# Patient Record
Sex: Female | Born: 1935 | Race: White | Hispanic: No | Marital: Married
Health system: Southern US, Community
[De-identification: ages and names within clinical notes are randomized; demographics above are authoritative.]

---

## 2004-07-24 ENCOUNTER — Ambulatory Visit: Payer: Self-pay | Admitting: Internal Medicine

## 2005-08-11 ENCOUNTER — Ambulatory Visit: Payer: Self-pay | Admitting: Internal Medicine

## 2006-08-12 ENCOUNTER — Ambulatory Visit: Payer: Self-pay | Admitting: Internal Medicine

## 2007-08-25 ENCOUNTER — Ambulatory Visit: Payer: Self-pay | Admitting: Internal Medicine

## 2008-09-06 ENCOUNTER — Ambulatory Visit: Payer: Self-pay | Admitting: Internal Medicine

## 2009-09-18 ENCOUNTER — Ambulatory Visit: Payer: Self-pay | Admitting: Internal Medicine

## 2010-10-02 ENCOUNTER — Ambulatory Visit: Payer: Self-pay | Admitting: Internal Medicine

## 2012-10-12 ENCOUNTER — Inpatient Hospital Stay: Payer: Self-pay | Admitting: Family Medicine

## 2012-10-12 LAB — CBC WITH DIFFERENTIAL/PLATELET
Basophil #: 0.1 10*3/uL (ref 0.0–0.1)
Eosinophil #: 0.1 10*3/uL (ref 0.0–0.7)
HCT: 32.8 % — ABNORMAL LOW (ref 35.0–47.0)
HGB: 10.6 g/dL — ABNORMAL LOW (ref 12.0–16.0)
Lymphocyte #: 0.8 10*3/uL — ABNORMAL LOW (ref 1.0–3.6)
Lymphocyte %: 12.3 %
MCH: 30.6 pg (ref 26.0–34.0)
MCHC: 32.3 g/dL (ref 32.0–36.0)
MCV: 95 fL (ref 80–100)
Monocyte #: 0.5 x10 3/mm (ref 0.2–0.9)
Monocyte %: 7.5 %
Neutrophil #: 5.3 10*3/uL (ref 1.4–6.5)
Neutrophil %: 77.5 %
Platelet: 228 10*3/uL (ref 150–440)
RBC: 3.46 10*6/uL — ABNORMAL LOW (ref 3.80–5.20)
WBC: 6.9 10*3/uL (ref 3.6–11.0)

## 2012-10-12 LAB — URINALYSIS, COMPLETE
Bilirubin,UR: NEGATIVE
Ketone: NEGATIVE
Nitrite: NEGATIVE
Ph: 5 (ref 4.5–8.0)
Protein: 30
Squamous Epithelial: NONE SEEN
WBC UR: 59 /HPF (ref 0–5)

## 2012-10-12 LAB — COMPREHENSIVE METABOLIC PANEL
Albumin: 3.4 g/dL (ref 3.4–5.0)
Alkaline Phosphatase: 137 U/L — ABNORMAL HIGH (ref 50–136)
Bilirubin,Total: 0.4 mg/dL (ref 0.2–1.0)
Calcium, Total: 8.2 mg/dL — ABNORMAL LOW (ref 8.5–10.1)
Chloride: 109 mmol/L — ABNORMAL HIGH (ref 98–107)
Creatinine: 0.84 mg/dL (ref 0.60–1.30)
EGFR (Non-African Amer.): >60
Osmolality: 288 (ref 275–301)
SGPT (ALT): 40 U/L (ref 12–78)
Sodium: 141 mmol/L (ref 136–145)

## 2012-10-12 LAB — APTT: Activated PTT: 36.4 secs — ABNORMAL HIGH (ref 23.6–35.9)

## 2012-10-12 LAB — CK TOTAL AND CKMB (NOT AT ARMC): CK-MB: 3.1 ng/mL (ref 0.5–3.6)

## 2012-10-12 LAB — PRO B NATRIURETIC PEPTIDE: B-Type Natriuretic Peptide: 2007 pg/mL — ABNORMAL HIGH (ref 0–450)

## 2012-10-13 LAB — CBC WITH DIFFERENTIAL/PLATELET
Basophil %: 0.7 %
HCT: 32.6 % — ABNORMAL LOW (ref 35.0–47.0)
HGB: 10.6 g/dL — ABNORMAL LOW (ref 12.0–16.0)
MCV: 93 fL (ref 80–100)
Neutrophil %: 77.1 %
RDW: 15.7 % — ABNORMAL HIGH (ref 11.5–14.5)

## 2012-10-13 LAB — BASIC METABOLIC PANEL
Anion Gap: 11 (ref 7–16)
Calcium, Total: 8.7 mg/dL (ref 8.5–10.1)
Chloride: 109 mmol/L — ABNORMAL HIGH (ref 98–107)
Co2: 24 mmol/L (ref 21–32)
Creatinine: 0.92 mg/dL (ref 0.60–1.30)
EGFR (African American): >60
Glucose: 163 mg/dL — ABNORMAL HIGH (ref 65–99)
Osmolality: 292 (ref 275–301)
Sodium: 144 mmol/L (ref 136–145)

## 2012-10-14 LAB — BASIC METABOLIC PANEL
Anion Gap: 10 (ref 7–16)
Calcium, Total: 8.5 mg/dL (ref 8.5–10.1)
Chloride: 109 mmol/L — ABNORMAL HIGH (ref 98–107)
EGFR (African American): >60
EGFR (Non-African Amer.): 58 — ABNORMAL LOW
Glucose: 84 mg/dL (ref 65–99)
Osmolality: 289 (ref 275–301)
Sodium: 144 mmol/L (ref 136–145)

## 2012-10-14 LAB — PROTIME-INR: Prothrombin Time: 19.1 secs — ABNORMAL HIGH (ref 11.5–14.7)

## 2012-10-15 LAB — URINE CULTURE

## 2014-09-20 ENCOUNTER — Inpatient Hospital Stay: Payer: Self-pay | Admitting: Family Medicine

## 2014-09-20 LAB — CBC
HCT: 43 % (ref 35.0–47.0)
HGB: 13.7 g/dL (ref 12.0–16.0)
MCH: 29.9 pg (ref 26.0–34.0)
MCHC: 31.8 g/dL — ABNORMAL LOW (ref 32.0–36.0)
MCV: 94 fL (ref 80–100)
PLATELETS: 204 10*3/uL (ref 150–440)
RBC: 4.57 10*6/uL (ref 3.80–5.20)
RDW: 17.4 % — ABNORMAL HIGH (ref 11.5–14.5)
WBC: 11.4 10*3/uL — AB (ref 3.6–11.0)

## 2014-09-20 LAB — COMPREHENSIVE METABOLIC PANEL
ALK PHOS: 124 U/L — AB
ANION GAP: 10 (ref 7–16)
AST: 19 U/L (ref 15–37)
Albumin: 3.7 g/dL (ref 3.4–5.0)
BUN: 20 mg/dL — AB (ref 7–18)
Bilirubin,Total: 0.9 mg/dL (ref 0.2–1.0)
CHLORIDE: 102 mmol/L (ref 98–107)
CO2: 25 mmol/L (ref 21–32)
CREATININE: 1.13 mg/dL (ref 0.60–1.30)
Calcium, Total: 9 mg/dL (ref 8.5–10.1)
EGFR (African American): 59 — ABNORMAL LOW
EGFR (Non-African Amer.): 49 — ABNORMAL LOW
Glucose: 279 mg/dL — ABNORMAL HIGH (ref 65–99)
Osmolality: 286 (ref 275–301)
Potassium: 4.2 mmol/L (ref 3.5–5.1)
SGPT (ALT): 30 U/L
Sodium: 137 mmol/L (ref 136–145)
TOTAL PROTEIN: 7.6 g/dL (ref 6.4–8.2)

## 2014-09-20 LAB — TROPONIN I: Troponin-I: <0.02 ng/mL

## 2014-09-20 LAB — LIPASE, BLOOD: LIPASE: 78 U/L (ref 73–393)

## 2014-09-20 LAB — PROTIME-INR
INR: 2.1
Prothrombin Time: 23.3 secs — ABNORMAL HIGH (ref 11.5–14.7)

## 2014-09-21 LAB — URINALYSIS, COMPLETE
Bilirubin,UR: NEGATIVE
Glucose,UR: >=500 mg/dL (ref 0–75)
NITRITE: NEGATIVE
Ph: 5 (ref 4.5–8.0)
Specific Gravity: 1.04 (ref 1.003–1.030)
WBC UR: 38 /HPF (ref 0–5)

## 2014-09-21 LAB — CBC WITH DIFFERENTIAL/PLATELET
BASOS ABS: 0 10*3/uL (ref 0.0–0.1)
BASOS PCT: 0.3 %
EOS ABS: 0 10*3/uL (ref 0.0–0.7)
Eosinophil %: 0.2 %
HCT: 36.8 % (ref 35.0–47.0)
HGB: 11.8 g/dL — AB (ref 12.0–16.0)
LYMPHS ABS: 0.6 10*3/uL — AB (ref 1.0–3.6)
Lymphocyte %: 4.9 %
MCH: 30.4 pg (ref 26.0–34.0)
MCHC: 32.1 g/dL (ref 32.0–36.0)
MCV: 95 fL (ref 80–100)
MONO ABS: 1.1 x10 3/mm — AB (ref 0.2–0.9)
Monocyte %: 9.4 %
NEUTROS PCT: 85.2 %
Neutrophil #: 9.6 10*3/uL — ABNORMAL HIGH (ref 1.4–6.5)
Platelet: 157 10*3/uL (ref 150–440)
RBC: 3.88 10*6/uL (ref 3.80–5.20)
RDW: 17.4 % — ABNORMAL HIGH (ref 11.5–14.5)
WBC: 11.2 10*3/uL — ABNORMAL HIGH (ref 3.6–11.0)

## 2014-09-21 LAB — BASIC METABOLIC PANEL
Anion Gap: 8 (ref 7–16)
BUN: 20 mg/dL — ABNORMAL HIGH (ref 7–18)
CALCIUM: 8.1 mg/dL — AB (ref 8.5–10.1)
CO2: 26 mmol/L (ref 21–32)
Chloride: 108 mmol/L — ABNORMAL HIGH (ref 98–107)
Creatinine: 1.02 mg/dL (ref 0.60–1.30)
EGFR (African American): >60
GFR CALC NON AF AMER: 55 — AB
Glucose: 208 mg/dL — ABNORMAL HIGH (ref 65–99)
Osmolality: 292 (ref 275–301)
Potassium: 3.8 mmol/L (ref 3.5–5.1)
Sodium: 142 mmol/L (ref 136–145)

## 2014-09-21 LAB — PROTIME-INR
INR: 3
Prothrombin Time: 30.1 secs — ABNORMAL HIGH (ref 11.5–14.7)

## 2014-09-21 LAB — MAGNESIUM: MAGNESIUM: 2 mg/dL

## 2014-09-22 LAB — CBC WITH DIFFERENTIAL/PLATELET
Basophil #: 0.1 10*3/uL (ref 0.0–0.1)
Basophil %: 0.8 %
EOS PCT: 0.5 %
Eosinophil #: 0.1 10*3/uL (ref 0.0–0.7)
HCT: 36.9 % (ref 35.0–47.0)
HGB: 11.9 g/dL — ABNORMAL LOW (ref 12.0–16.0)
LYMPHS ABS: 0.6 10*3/uL — AB (ref 1.0–3.6)
Lymphocyte %: 5.8 %
MCH: 30.2 pg (ref 26.0–34.0)
MCHC: 32.2 g/dL (ref 32.0–36.0)
MCV: 94 fL (ref 80–100)
MONOS PCT: 8.2 %
Monocyte #: 0.9 x10 3/mm (ref 0.2–0.9)
NEUTROS ABS: 9.3 10*3/uL — AB (ref 1.4–6.5)
Neutrophil %: 84.7 %
Platelet: 154 10*3/uL (ref 150–440)
RBC: 3.94 10*6/uL (ref 3.80–5.20)
RDW: 17.1 % — AB (ref 11.5–14.5)
WBC: 11 10*3/uL (ref 3.6–11.0)

## 2014-09-22 LAB — COMPREHENSIVE METABOLIC PANEL
ALT: 17 U/L
Albumin: 2.5 g/dL — ABNORMAL LOW (ref 3.4–5.0)
Alkaline Phosphatase: 98 U/L
Anion Gap: 9 (ref 7–16)
BILIRUBIN TOTAL: 0.9 mg/dL (ref 0.2–1.0)
BUN: 18 mg/dL (ref 7–18)
CHLORIDE: 111 mmol/L — AB (ref 98–107)
CO2: 24 mmol/L (ref 21–32)
Calcium, Total: 8.3 mg/dL — ABNORMAL LOW (ref 8.5–10.1)
Creatinine: 1.05 mg/dL (ref 0.60–1.30)
EGFR (African American): >60
EGFR (Non-African Amer.): 53 — ABNORMAL LOW
Glucose: 167 mg/dL — ABNORMAL HIGH (ref 65–99)
OSMOLALITY: 293 (ref 275–301)
Potassium: 3.8 mmol/L (ref 3.5–5.1)
SGOT(AST): 21 U/L (ref 15–37)
Sodium: 144 mmol/L (ref 136–145)
Total Protein: 6.3 g/dL — ABNORMAL LOW (ref 6.4–8.2)

## 2014-09-22 LAB — PROTIME-INR
INR: 2.2
Prothrombin Time: 23.8 secs — ABNORMAL HIGH (ref 11.5–14.7)

## 2014-09-23 LAB — PROTIME-INR
INR: 1.6
Prothrombin Time: 18.6 secs — ABNORMAL HIGH (ref 11.5–14.7)

## 2014-09-24 LAB — CBC WITH DIFFERENTIAL/PLATELET
BASOS ABS: 0.1 10*3/uL (ref 0.0–0.1)
Basophil %: 0.8 %
EOS PCT: 1.7 %
Eosinophil #: 0.1 10*3/uL (ref 0.0–0.7)
HCT: 36.3 % (ref 35.0–47.0)
HGB: 11.7 g/dL — ABNORMAL LOW (ref 12.0–16.0)
LYMPHS PCT: 8.7 %
Lymphocyte #: 0.7 10*3/uL — ABNORMAL LOW (ref 1.0–3.6)
MCH: 30.5 pg (ref 26.0–34.0)
MCHC: 32.1 g/dL (ref 32.0–36.0)
MCV: 95 fL (ref 80–100)
MONOS PCT: 9.4 %
Monocyte #: 0.7 x10 3/mm (ref 0.2–0.9)
Neutrophil #: 6.1 10*3/uL (ref 1.4–6.5)
Neutrophil %: 79.4 %
Platelet: 170 10*3/uL (ref 150–440)
RBC: 3.82 10*6/uL (ref 3.80–5.20)
RDW: 16.7 % — AB (ref 11.5–14.5)
WBC: 7.7 10*3/uL (ref 3.6–11.0)

## 2014-09-24 LAB — COMPREHENSIVE METABOLIC PANEL
ALBUMIN: 2.3 g/dL — AB (ref 3.4–5.0)
ANION GAP: 10 (ref 7–16)
AST: 33 U/L (ref 15–37)
Alkaline Phosphatase: 134 U/L — ABNORMAL HIGH
BILIRUBIN TOTAL: 1.1 mg/dL — AB (ref 0.2–1.0)
BUN: 17 mg/dL (ref 7–18)
CREATININE: 0.95 mg/dL (ref 0.60–1.30)
Calcium, Total: 8.2 mg/dL — ABNORMAL LOW (ref 8.5–10.1)
Chloride: 111 mmol/L — ABNORMAL HIGH (ref 98–107)
Co2: 24 mmol/L (ref 21–32)
EGFR (African American): >60
EGFR (Non-African Amer.): 59 — ABNORMAL LOW
Glucose: 169 mg/dL — ABNORMAL HIGH (ref 65–99)
Osmolality: 294 (ref 275–301)
POTASSIUM: 3.2 mmol/L — AB (ref 3.5–5.1)
SGPT (ALT): 24 U/L
Sodium: 145 mmol/L (ref 136–145)
TOTAL PROTEIN: 6.4 g/dL (ref 6.4–8.2)

## 2014-09-24 LAB — PROTIME-INR
INR: 1.5
PROTHROMBIN TIME: 17.9 s — AB (ref 11.5–14.7)

## 2014-09-25 LAB — PROTIME-INR
INR: 1.5
Prothrombin Time: 17.5 secs — ABNORMAL HIGH (ref 11.5–14.7)

## 2014-09-25 LAB — COMPREHENSIVE METABOLIC PANEL
ALBUMIN: 2.3 g/dL — AB (ref 3.4–5.0)
ALK PHOS: 131 U/L — AB
ANION GAP: 10 (ref 7–16)
AST: 31 U/L (ref 15–37)
BILIRUBIN TOTAL: 0.9 mg/dL (ref 0.2–1.0)
BUN: 13 mg/dL (ref 7–18)
Calcium, Total: 8.1 mg/dL — ABNORMAL LOW (ref 8.5–10.1)
Chloride: 111 mmol/L — ABNORMAL HIGH (ref 98–107)
Co2: 24 mmol/L (ref 21–32)
Creatinine: 0.84 mg/dL (ref 0.60–1.30)
EGFR (Non-African Amer.): >60
GLUCOSE: 148 mg/dL — AB (ref 65–99)
Osmolality: 292 (ref 275–301)
Potassium: 3.2 mmol/L — ABNORMAL LOW (ref 3.5–5.1)
SGPT (ALT): 24 U/L
SODIUM: 145 mmol/L (ref 136–145)
Total Protein: 6.2 g/dL — ABNORMAL LOW (ref 6.4–8.2)

## 2014-09-25 LAB — CBC WITH DIFFERENTIAL/PLATELET
BASOS ABS: 0 10*3/uL (ref 0.0–0.1)
BASOS PCT: 0.3 %
EOS ABS: 0.2 10*3/uL (ref 0.0–0.7)
EOS PCT: 2.6 %
HCT: 35.1 % (ref 35.0–47.0)
HGB: 11.2 g/dL — ABNORMAL LOW (ref 12.0–16.0)
Lymphocyte #: 0.5 10*3/uL — ABNORMAL LOW (ref 1.0–3.6)
Lymphocyte %: 6.5 %
MCH: 30.1 pg (ref 26.0–34.0)
MCHC: 31.9 g/dL — ABNORMAL LOW (ref 32.0–36.0)
MCV: 94 fL (ref 80–100)
MONO ABS: 0.7 x10 3/mm (ref 0.2–0.9)
Monocyte %: 9.6 %
NEUTROS ABS: 5.7 10*3/uL (ref 1.4–6.5)
Neutrophil %: 81 %
PLATELETS: 157 10*3/uL (ref 150–440)
RBC: 3.73 10*6/uL — ABNORMAL LOW (ref 3.80–5.20)
RDW: 16.7 % — AB (ref 11.5–14.5)
WBC: 7 10*3/uL (ref 3.6–11.0)

## 2014-09-26 LAB — COMPREHENSIVE METABOLIC PANEL
Albumin: 2.2 g/dL — ABNORMAL LOW (ref 3.4–5.0)
Alkaline Phosphatase: 116 U/L
Anion Gap: 9 (ref 7–16)
BUN: 11 mg/dL (ref 7–18)
Bilirubin,Total: 0.8 mg/dL (ref 0.2–1.0)
CO2: 25 mmol/L (ref 21–32)
CREATININE: 0.91 mg/dL (ref 0.60–1.30)
Calcium, Total: 7.8 mg/dL — ABNORMAL LOW (ref 8.5–10.1)
Chloride: 107 mmol/L (ref 98–107)
EGFR (African American): >60
EGFR (Non-African Amer.): >60
Glucose: 171 mg/dL — ABNORMAL HIGH (ref 65–99)
Osmolality: 285 (ref 275–301)
Potassium: 3 mmol/L — ABNORMAL LOW (ref 3.5–5.1)
SGOT(AST): 26 U/L (ref 15–37)
SGPT (ALT): 22 U/L
SODIUM: 141 mmol/L (ref 136–145)
TOTAL PROTEIN: 5.9 g/dL — AB (ref 6.4–8.2)

## 2014-09-26 LAB — CBC WITH DIFFERENTIAL/PLATELET
Basophil #: 0.1 10*3/uL (ref 0.0–0.1)
Basophil %: 0.8 %
EOS PCT: 3.6 %
Eosinophil #: 0.2 10*3/uL (ref 0.0–0.7)
HCT: 35.1 % (ref 35.0–47.0)
HGB: 11.1 g/dL — ABNORMAL LOW (ref 12.0–16.0)
LYMPHS ABS: 0.6 10*3/uL — AB (ref 1.0–3.6)
LYMPHS PCT: 9.5 %
MCH: 30.1 pg (ref 26.0–34.0)
MCHC: 31.7 g/dL — AB (ref 32.0–36.0)
MCV: 95 fL (ref 80–100)
Monocyte #: 0.5 x10 3/mm (ref 0.2–0.9)
Monocyte %: 7.6 %
NEUTROS PCT: 78.5 %
Neutrophil #: 5 10*3/uL (ref 1.4–6.5)
PLATELETS: 151 10*3/uL (ref 150–440)
RBC: 3.69 10*6/uL — AB (ref 3.80–5.20)
RDW: 16.3 % — ABNORMAL HIGH (ref 11.5–14.5)
WBC: 6.3 10*3/uL (ref 3.6–11.0)

## 2014-09-27 ENCOUNTER — Encounter: Payer: Self-pay | Admitting: Internal Medicine

## 2014-09-27 LAB — PROTIME-INR
INR: 1.3
Prothrombin Time: 16.1 secs — ABNORMAL HIGH (ref 11.5–14.7)

## 2014-09-28 LAB — PROTIME-INR
INR: 1.2
PROTHROMBIN TIME: 14.9 s — AB (ref 11.5–14.7)

## 2014-09-29 LAB — PROTIME-INR
INR: 1.3
Prothrombin Time: 15.6 secs — ABNORMAL HIGH (ref 11.5–14.7)

## 2014-09-29 LAB — WOUND CULTURE

## 2014-10-01 LAB — PROTIME-INR
INR: 1.4
PROTHROMBIN TIME: 16.7 s — AB (ref 11.5–14.7)

## 2014-10-03 LAB — PROTIME-INR
INR: 1.7
Prothrombin Time: 19.5 secs — ABNORMAL HIGH (ref 11.5–14.7)

## 2014-10-05 ENCOUNTER — Encounter: Payer: Self-pay | Admitting: Internal Medicine

## 2014-10-09 LAB — PROTIME-INR
INR: 3.4
Prothrombin Time: 33.1 secs — ABNORMAL HIGH (ref 11.5–14.7)

## 2014-10-16 LAB — PROTIME-INR
INR: 3.5
Prothrombin Time: 34.1 secs — ABNORMAL HIGH (ref 11.5–14.7)

## 2014-10-19 LAB — PROTIME-INR
INR: 2.6
Prothrombin Time: 26.9 secs — ABNORMAL HIGH (ref 11.5–14.7)

## 2015-01-25 NOTE — Discharge Summary (Signed)
PATIENT NAME:  Linda ChestnutMINCEY, Christle P MR#:  [id number] DATE OF BIRTH:  01/31/1927  DATE OF ADMISSION:  10/12/2012 DATE OF DISCHARGE:  10/14/2012  DISCHARGE DIAGNOSES: 1. Acute respiratory failure secondary to pulmonary edema secondary to new diastolic congestive heart failure, ejection fraction greater than 55%.  2. Urinary tract infection.  3. Atrial fibrillation, on Coumadin.  4. Insulin-dependent diabetes.  5. Hypertension.   DISCHARGE MEDICATIONS: 1. Humalog mix 75/25, 18 units in the morning and 15 units in the evening.  2. Enalapril 20 mg p.o. 2 times a day.  3. Dexamethasone liquid as directed. 4. Glimepiride 10 mg p.o. daily.  5. Atorvastatin 20 mg p.o. at bedtime.  6. Warfarin 2.5 mg daily Monday through Saturday and 5 mg on Sunday.  7. Metoprolol 100 mg extended-release succinate 1-1/2 tabs daily.  8. Furosemide 20 mg p.o. daily.  9. Potassium chloride 20 milliequivalents p.o. daily.  10. Ciprofloxacin 250 mg p.o. 2 times a day x 1 more day.   CONSULTANTS: None.   PROCEDURES: None.   PERTINENT LABS: On day of discharge, sodium 144, potassium 3.3, creatinine 0.92 and glucose 84. INR 1.6.   Echo did show EF greater than 55% with increased right ventricular systolic pressures.   Urinalysis: 2+ leukocyte esterase.   BRIEF HOSPITAL COURSE:  1. Acute respiratory failure. The patient initially came in with acute respiratory failure secondary to pulmonary edema seen on chest x-ray and underwent echocardiogram that did show diastolic congestive heart failure. She was diuresed with Lasix and responded appropriately. The plan is to discharge her on 20 mg of Lasix daily and 20 milliequivalents of potassium chloride daily. Continue with her ACE and her beta blocker at this time.  2. Urinary tract infection. The patient came in with acute onset of UTI. Treatment with Cipro 250 mg 2 times a day x 3 days. Will need to monitor the INR while she is on this.  3. Atrial fibrillation, on Coumadin.  She is stable except for a mildly subtherapeutic INR. We will increase to Coumadin 5 mg on Sunday and 2.5 mg on Monday through Saturday. She is rate controlled. We will continue the current regimen.  4. Her other chronic medical issues remained stable. No changes to her medications. She was evaluated by physical therapy who did recommend home health PT. She will get that on discharge. Follow up with Dr. Burnadette PopLinthavong in 1 to 2 weeks. ____________________________ Marisue IvanKanhka Houa Ackert, MD kl:sb D: 10/14/2012 12:49:37 ET T: 10/14/2012 13:10:17 ET JOB#: [id number]  cc: Marisue IvanKanhka Bellamy Rubey, MD, <Dictator> Marisue IvanKANHKA Azariah Latendresse MD ELECTRONICALLY SIGNED 10/21/2012 10:40

## 2015-01-25 NOTE — H&P (Signed)
PATIENT NAME:  Linda Sullivan, Linda Sullivan MR#:  [id number] DATE OF BIRTH:  10-05-1927  DATE OF ADMISSION:  10/12/2012  PRIMARY CARE PHYSICIAN:  Dr. Burnadette PopLinthavong, [facility].   HISTORY OF PRESENTING ILLNESS:  Shortness of breath and chronic weakness. This is an 81 year old female with past medical history of A. fib, diabetes mellitus, arthritis, hypertension. She lives alone at home with her husband and has to use a cane for walking. For the last few days, she said that she is getting progressively weaker and feeling shortness of breath while walking. She also noticed that her urination frequency is increased. She was advised to take hydrochlorothiazide by her primary care physician for blood pressure control, but she stopped taking it because of increased frequency. For the last 3 to 4 days, she did not take it, and she feels that the symptoms are a result of that. She denies any cough or fever. She said that for the last 5 days she has not taken her diuretic pills, but she still has increased frequency, and her urine smells funny.   REVIEW OF SYSTEMS:  GENERAL:  Denies any fever. Has generalized weakness, but denies any weight loss or weight gain.  EYES:  Denies any blurring or double lesion.  ENT:  Denies any tinnitus, ear pain or hearing loss.  RESPIRATORY:  Denies any cough or wheezing or hemoptysis. Has some shortness of breath.  CARDIOVASCULAR:  Denies any chest pain, orthopnea. Has edema on the legs. Denies any arrhythmia or palpitations or syncopal episodes.  GASTROINTESTINAL:  Denies any nausea, vomiting, diarrhea or abdominal pain.  GENITOURINARY:   Has increased frequency of the urine. Denies any dysuria or hematuria.  HEMATOLOGY:  Denies anemia, easy bruising and bleeding.  MUSCULOSKELETAL:  Denies any pain or swelling on the joints.  NEUROLOGICAL:  Denies numbness, weakness, dysarthria and tremors.  PSYCHIATRIC:  Denies any anxiety, insomnia or bipolar disorder.   PAST MEDICAL HISTORY:   Diabetes mellitus, hypertension, arteritis, A. fib.   PAST SURGICAL HISTORY:  Total knee replacement on the left side, hysterectomy, D and C, varicose vein surgery in the left leg.   ALLERGIES:  No known drug allergies.   FAMILY HISTORY:  Positive for breast cancer in a sister. No history of diabetes in the family.   SOCIAL HISTORY:  Lives with husband.  Walks with a cane. No smoking. No drinking alcohol. No illicit drug use.   HOME MEDICATIONS:  She takes insulin 20 units Humalog 75/25 in the evening, and 22 units 75/25 Humalog in the morning. For hypertension, she was advised to take hydrochlorothiazide, but she does not take it regularly.  For arthritis and back pain, she takes pain medications for that. For A. fib, she takes 2.5 mg of Coumadin, which was higher earlier, but changed recently to 2.5 mg.   VITAL SIGNS:  Temperature 98.4, pulse rate 84, respirations 20, blood pressure 156/65, saturation 88% on room air on exertion, and on resting 96% with oxygen supplementation 2 liters nasal cannula.   LABORATORY RESULTS:  Glucose 166. BNP 2007. BUN 22, creatinine 0.84, sodium 141, potassium 4.4, chloride 109, CO2 of 22, calcium 8.2. Total protein 6.7, albumin 3.4, bilirubin 0.4, alkaline phosphatase 137, SGOT 40, SGPT 40. Troponin less than 0.02. WBC 6.9, hemoglobin 10.6, platelet count 228, MCV 95.  Prothrombin 21.1, INR 1.8, activated PTT 36.4. Urinalysis grossly positive with 59 WBC per high power field, 2+ leukocyte esterase and 1+ bacteria positive.   PHYSICAL EXAMINATION:  GENERAL APPEARANCE:  She is fully  alert, oriented to time, place and person, obese. Not in any acute distress, fully cooperative with physical examination and history taking.  HEENT:  Head and neck atraumatic. Conjunctivae pink. Oral mucosa moist.  NECK:  Supple. No JVD.  CARDIOVASCULAR:  S1, S2 present, regular. No murmur appreciated.  RESPIRATORY:  Bilateral equal air entry, a few creps present.  ABDOMEN:   Soft,  nontender. Bowel sounds present. No organomegaly.  EXTREMITIES:  Bilateral edema present, pitting.  SKIN:  No rashes.  NEUROLOGICAL:  Power 4/5 in all 4 limbs. Follows commands. No tremor.  PSYCHIATRIC:  Does not appear in any acute psychiatric illness at this point of time.   ASSESSMENT AND PLAN:  An 81 year old female with history of diabetes, hypertension, atrial fibrillation, arthritis, came to the Emergency Room with complaint of worsening shortness of breath, found having congestion on chest x-ray, high BNP and x-ray with congestion.  1.  New onset congestive heart failure. The patient says she never had history of CHF.  We will admit her to telemetry floor. We will cycle the troponins. Give Lasix IV, 1 dose was given by ER. Echocardiogram and cardiology consult for further management. Beta blocker and ACE inhibitor as tolerated by blood pressure.  2.  Atrial fibrillation. She was taking Coumadin 2.5 mg. INR is 1.8. We will continue at this point of time, and may need to increase if it does not go up in the next day or two.  3.  Diabetes. We will 70/30 insulin 20 units b.i.d. and glucose checks with coverage.  4.  Hypertension. We will give her beta blocker, Coreg; and ACE inhibitor, lisinopril, small dose.  5.  Urinary tract infection.  We will give Rocephin IV at this point of time, and wait for the urine culture for further management.  6.  Deep vein thrombosis prophylaxis. INR is 1.8 with Coumadin.  7.  Gastrointestinal prophylaxis with oral pantoprazole.   Primary care physician, Dr. Burnadette Pop .[facility] on-call doctor for further management.   TOTAL TIME SPENT: 55 minutes.    ____________________________ Hope Sullivan Linda Pigeon, MD vgv:dm D: 10/12/2012 22:41:07 ET T: 10/12/2012 22:50:59 ET JOB#: [id number]  cc: Hope Sullivan. Linda Pigeon, MD, <Dictator> Altamese Dilling MD ELECTRONICALLY SIGNED 11/15/2012 17:23

## 2015-01-26 NOTE — Consult Note (Signed)
PATIENT NAME:  Linda Sullivan, Linda Sullivan MR#:  [id number] DATE OF BIRTH:  [birth year]  DATE OF CONSULTATION:  09/20/2014  REFERRING PHYSICIAN:   CONSULTING PHYSICIAN:  Claude Manges, MD  HISTORY OF PRESENT ILLNESS: Linda Sullivan is an 81 year old white female who is in her normal state of health until yesterday (Wednesday) about this time, when she developed fairly diffuse abdominal pain that was slightly worse on the right than the left. This radiated to her back and was associated with nausea, anorexia, and vomiting. She vomited about 10 times last night prior to midnight. She did not sleep well and this morning, although she has not vomited, she has remained anorexic. Liver function tests are normal and white blood cell count is minimally elevated, lipase is normal. Gallbladder ultrasound revealed no gallstones or wall thickening with no pericholecystic fluid and a 3 mm common bile duct, as well as diffusely increased liver echogenicity (consistent with hepatic steatosis and/or chronic passive congestion). CT scan of the liver showed mild distention of the gallbladder with thickening of the gallbladder wall up to 4.7 mm with a small amount of pericholecystic fluid, as well as extensive atherosclerotic calcifications throughout the abdominal aorta, splenic artery, and bilateral renal arteries. The mesenteric circulation was not specifically mentioned. The patient's INR is elevated at 2.1 because she takes chronic Coumadin.   PAST MEDICAL HISTORY:  1. Acute respiratory failure January 2014 secondary to exacerbated diastolic congestive heart failure.  2. Diastolic congestive heart failure.  3. Atrial fibrillation.  4. Diabetes mellitus.  5. Hypertension.  6. Hearing loss.   MEDICATIONS: Enalapril 20 mg b.i.d., Coumadin 2.5 mg 5 days a week with 3 mg 2 days a week, glimepiride 2 mg daily, Lantus 12 units subcutaneous at bedtime. NovoLog 10 units subcutaneous q.a.m. and 10 units q. noon, 18 units at bedtime.  Atorvastatin 20 mg at bedtime, metoprolol 150 mg daily, Lasix 20 mg daily, potassium chloride 20 mEq daily   ALLERGIES: ASPIRIN.   SOCIAL HISTORY: The patient lives with her 10 year old husband, and her daughter comes and stays with them frequently. 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 She has never smoked cigarettes and never drank alcohol.   FAMILY HISTORY: Noncontributory.   PHYSICAL EXAMINATION: GENERAL: Height 5 feet 6 inches, weight 220 pounds. BMI 35.5 temperature 98.1, pulse 97, respirations 18, blood pressure 149/72, oxygen saturation 96% on room air.  HEENT: Pupils equally round and reactive to light. Extraocular movements intact. Sclerae anicteric. Oropharynx clear. Mucous membranes moist. Hearing intact to voice, although she is significantly hard of hearing.  NECK: Supple with no jugular venous distention, and the trachea is in the midline.  HEART: Regular rate and rhythm with no murmurs or rubs. LUNGS: Clear to auscultation with normal respiratory effort bilaterally.  ABDOMEN: Nondistended and diffusely tender, worse on the right than the left and worse in the right upper quadrant than the  right lower quadrant. She does, indeed, have some voluntary guarding and possible rebound tenderness. There is no tympany.  EXTREMITIES: No edema with normal capillary refill bilaterally.  NEUROLOGIC: Cranial nerves II-XII, motor, and sensation grossly intact.  PSYCHIATRIC: Alert and oriented x 4. Appropriate affect.   ASSESSMENT AND PLAN: Acute acalculous cholecystitis in a patient with compromised cardiovascular function. This is most likely due to ischemia. The ideal therapy for this would be a cholecystectomy, but since the patient is anticoagulated and cannot have a cholecystectomy at this time, I suggested that she be admitted to the  internal medicine service and be treated with IV antibiotics until her INR decreases into an acceptable range to undergo  percutaneous cholecystostomy tube placement. In the interim, if her cardiac function could be evaluated and optimized, hopefully the perfusion of her gallbladder wall may improve, and also, possibly allow her to be an operative candidate.   ____________________________ Claude MangesWilliam F. Latrell Reitan, MD wfm:mw D: 09/20/2014 14:34:43 ET T: 09/20/2014 16:35:36 ET JOB#: [id number]  cc: Claude MangesWilliam F. Sayyid Harewood, MD, <Dictator> Claude MangesWILLIAM F Canesha Tesfaye MD ELECTRONICALLY SIGNED 09/21/2014 9:41

## 2015-01-26 NOTE — Consult Note (Signed)
Brief Consult Note: Diagnosis: acute acalculous cholecystitis.   Patient was seen by consultant.   Consult note dictated.   Recommend to proceed with surgery or procedure.   Recommend further assessment or treatment.   Discussed with Attending MD.   Comments: No gallstones, but RUQ pain, tenderness, nausea, vomiting, and anorexia, with a thickened GB wall on CT. Pt with diastolic CHF, likely hepatic venous congestion, A fib, on Coumadin. Suggested Tx is IV ABx, with percutaneous cholecystostomy tube drainage once INR is in an acceptable range for interventional radiology (since they go through the liver). Will follow.  Electronic Signatures: Claude MangesMarterre, Halee Glynn F (MD)  (Signed 17-Dec-15 14:24)  Authored: Brief Consult Note   Last Updated: 17-Dec-15 14:24 by Claude MangesMarterre, Amaru Burroughs F (MD)

## 2015-01-26 NOTE — H&P (Signed)
PATIENT NAME:  Linda Sullivan, Linda Sullivan MR#:  [id number] DATE OF BIRTH:  [birth year]  DATE OF ADMISSION:  09/20/2014  PRIMARY CARE PHYSICIAN: Marisue IvanKanhka Linthavong, MD  CHIEF COMPLAINT: Right-sided abdominal pain since yesterday.   HISTORY OF PRESENT ILLNESS: An 81 year old Caucasian female with a history of hypertension, diabetes, A-fib, and CHF who presented to the ED from home due to right-side abdominal pain since yesterday. The patient is alert, awake, and oriented, in no acute distress. The patient said she started to have right upper quadrant pain since last night with nausea and vomiting. Symptoms have been worsening today so she came to the ED for further evaluation. The patient denies any subjective fever or chills. No hematemesis or melena or bloody stool. Abdominal pain is in right upper quadrant, sharp and constant with radiation to the right side of back with maximum about 8 out of 10. The patient denies any diarrhea or constipation. The patient's CAT scan showed cholecystitis with pericholecystic fluid. ED physician, Dr. Mayford KnifeWilliams, discussed with on-call surgeon who suggest of no indication for surgery, but needs medical treatment with antibiotics and suggest the patient may need to get pericholecystic fluid drainage.   PAST MEDICAL HISTORY: Hypertension, diabetes, CHF, A-fib.  PAST SURGICAL HISTORY: Total knee replacement on the left side, hysterectomy and varicose vein surgery in the left leg.   FAMILY HISTORY: Positive for breast cancer in her sister, but no diabetes in her family.   SOCIAL HISTORY: No smoking, alcohol drinking or illicit drugs.   ALLERGIES: None.   HOME MEDICATIONS:  1.  Coumadin 2.5 mg Sullivan.o. once a day on Monday, Tuesday, Wednesday, Thursday and Friday and 3 mg on Saturday and Sunday. 2.  Potassium 20 mEq Sullivan.o. daily. 3.  NovoLog 10 units in the morning, 10 units at noon and 18 units at bedtime. 4.  Lopressor 100 mg 1-1/2 tablets Sullivan.o. once a day.  5.  Lantus 12 units  subcutaneous once a day at bedtime. 6.  Glimepiride 4 mg Sullivan.o. 1/2 tablet once a day.  7.  Lasix 20 mg Sullivan.o. daily.  8.  Atorvastatin 20 mg Sullivan.o. daily at bedtime.   REVIEW OF SYSTEMS: CONSTITUTIONAL: The patient denies any fever or chills. No headache or dizziness, but has weakness.  EYES: No double vision or blurred vision. ENT: No postnasal drip, slurred speech or dysphagia.  CARDIOVASCULAR: No chest pain, palpitation. No orthopnea or nocturnal dyspnea. No leg edema.  PULMONARY: No cough, sputum, shortness of breath, or hematemesis.  GASTROINTESTINAL: Positive for abdominal pain, nausea and vomiting, but no diarrhea, melena, or bloody stool.  GENITOURINARY: No dysuria, hematuria, or incontinence.  SKIN: No rash or jaundice.  NEUROLOGY: No syncope, loss of consciousness, or seizure.  ENDOCRINE: No polyuria, polydipsia, heat or cold intolerance.  HEMATOLOGY: No easy bruising or bleeding.  PHYSICAL EXAMINATION: VITAL SIGNS: Temperature 98.1, blood pressure 119/64, pulse 89, O2 saturation 95% in room air.  GENERAL: The patient is alert, awake, and oriented, in no acute distress.  HEENT: Pupils round, equal, and reactive to light and accommodation. Moist oral mucosa. Clear oropharynx.  NECK: Supple. No JVD or carotid bruit. No lymphadenopathy. No thyromegaly.   CARDIOVASCULAR: S1 and S2 irregular rate and rhythm. No murmurs or gallops.  PULMONARY: Bilateral air entry. No wheezing or rales. No use of accessory muscles to breathe.  ABDOMEN: Soft. Tenderness on the right side, especially on the right upper quadrant. No rigidity. No rebound. Bowel sounds weak. No obvious organomegaly.  EXTREMITIES: No edema, clubbing or cyanosis. No  calf tenderness. Bilateral pedal pulses present.  SKIN: No rash or jaundice.  NEUROLOGY: Alert and oriented x3. No focal deficit. Power 5/5. Sensory intact.   DIAGNOSTIC DATA: CAT scan of abdomen and pelvis showed abnormal enhancement and mild thickening of  gallbladder wall up to 4.7 mm. Small amount of pericholecystic fluid, findings highly suspicious for acute cholecystitis.   Abdominal ultrasound showed increased liver echogenicity, finding likely indicative of hepatitic steatosis.   WBC 11.4, hemoglobin 13.7, platelets 204,000. Lipase 78. Glucose 279, BUN 20, creatinine 1.13. Electrolytes normal. INR 2.1. Troponin less than 0.02.   IMPRESSION: 1.  Acute cholecystitis.  2.  History of atrial fibrillation, rate controlled.  3.  Hypertension.  4.  Diabetes.  5.  History of chronic diastolic congestive heart failure.   PLAN OF TREATMENT: 1.  The patient will be admitted to medical floor for acute cholecystitis. According to surgeon, the patient is not a candidate for surgery, but needs antibiotics and drainage. We will start Zosyn and follow up blood culture, CBC, follow up with surgeon for further recommendation for drainage.  2.  For diabetes, we will hold Lantus and NovoLog t.i.d., but start sliding scale since the patient will be keep n.Sullivan.o.  3.  For hypertension, continue the patient's home hypertension medication. 4.  For atrial fibrillation, continue Lopressor but hold Coumadin and follow up the INR. Since the patient may get drainage, we will hold Coumadin but may resume Coumadin after procedure.   I discussed the patient's condition and plan of treatment with the patient and the patient's daughter. The patient wants FULL code, but she does not want long term artificial ventilation.  TIME SPENT: About 67 minutes.    ____________________________ Shaune Pollack, MD qc:sb D: 09/20/2014 15:27:28 ET T: 09/20/2014 15:46:19 ET JOB#: [id number]  cc: Shaune Pollack, MD, <Dictator> Shaune Pollack MD ELECTRONICALLY SIGNED 09/21/2014 10:48

## 2015-01-30 NOTE — Discharge Summary (Signed)
PATIENT NAME:  Linda ChestnutMINCEY, Rima P MR#:  [id number] DATE OF BIRTH:  [birth year]  DATE OF ADMISSION:  09/20/2014 DATE OF DISCHARGE:  09/26/2014  DISCHARGE DIAGNOSES:  1. Acute cholecystitis status post percutaneous cholecystectomy tube drainage.  2. Atrial fibrillation, on Coumadin.  3. Insulin-dependent diabetes.  4. Hypertension.   DISCHARGE MEDICATIONS:  1. Atorvastatin 20 mg p.o. at bedtime.  2. Furosemide 20 mg p.o. daily.  3. Potassium chloride 20 mEq p.o. daily.  4. Enalapril 20 mg p.o. b.i.d.  5. Metoprolol succinate 100 mg 1-1/2 tabs p.o. daily.  6. Glimepiride 4 mg half tab p.o. daily with meal.  7. NovoLog 10 units subcutaneous in the morning, 10 units at noon, 18 units at bedtime.  8. Lantus 12 units at bedtime.  9. Warfarin 3 mg p.o. daily.  10. Acetaminophen 325 mg 2 tabs every 4 hours as needed for pain and fever.  11. Tramadol 50 mg q.6 hours as needed for severe pain.   CONSULTS: Surgery, interventional radiology.   PROCEDURES: The patient had a percutaneous drain placed on 09/25/2014.   PERTINENT LABORATORY ON DAY OF DISCHARGE: Sodium 141, potassium 3, creatinine 0.91, glucose 171. White blood cell count 6.3, hemoglobin 11.1, platelets 151, INR 1.5.   BRIEF HOSPITAL COURSE:  1. Acute cholecystitis. The patient initially came in with acute right upper quadrant pain and was found to have acute cholecystitis on CT scan. HIDA scan also confirmed acute cholecystitis. She was evaluated by surgery who did not recommend cholecystectomy at this time given her findings and her underlying medical condition. Instead, interventional radiology placed a percutaneous drain. The patient tolerated the procedure well, is stable at this time, is afebrile and had a normal white blood cell count, has been on Zosyn for 7 days. Plan is to discharge to a skilled nursing facility where she will receive nursing care and rehab with physical therapy and also nurse aide. We will keep the drain in for 4 to  6 weeks pending. surgery's recommendations. We will discontinue the antibiotics at this time. We will advance her diet as tolerated. 2. Atrial fibrillation, on Coumadin. Her Coumadin was held during the procedure in preparation for the procedure. I restarted Coumadin 3 mg. She is typically on 2.5 five days a week and 3 mg 2 days a week but, given the INR being low, we will be a little more aggressive in the next few days. Will need her INR checked every day for the next 72 hours.  3. Other chronic issues are stable. Continue home regimen.   DISPOSITION: She is in stable condition and will be [facility] for further rehab and nursing care. Follow up with Dr. Burnadette PopLinthavong upon discharge. Again, she is going to need surgery followup as well to maintain and manage this cholecystostomy drain.   ____________________________ Marisue IvanKanhka Abu Heavin, MD kl:jh D: 09/26/2014 08:47:00 ET T: 09/26/2014 12:03:25 ET JOB#: [id number]  cc: Marisue IvanKanhka Nicoya Friel, MD, <Dictator> Marisue IvanKANHKA Milford Cilento MD ELECTRONICALLY SIGNED 10/18/2014 12:01

## 2015-05-26 IMAGING — US ABDOMEN ULTRASOUND LIMITED
1 series · 14 of 25 positions shown · non-contrast
Comparison: None.

CLINICAL DATA: Right upper quadrant pain with nausea and vomiting

EXAM:
US ABDOMEN LIMITED - RIGHT UPPER QUADRANT

[Series 1: abdomen ultrasound limited · 0.22mm/px · 14 of 47 slices shown]
[im 1/47]
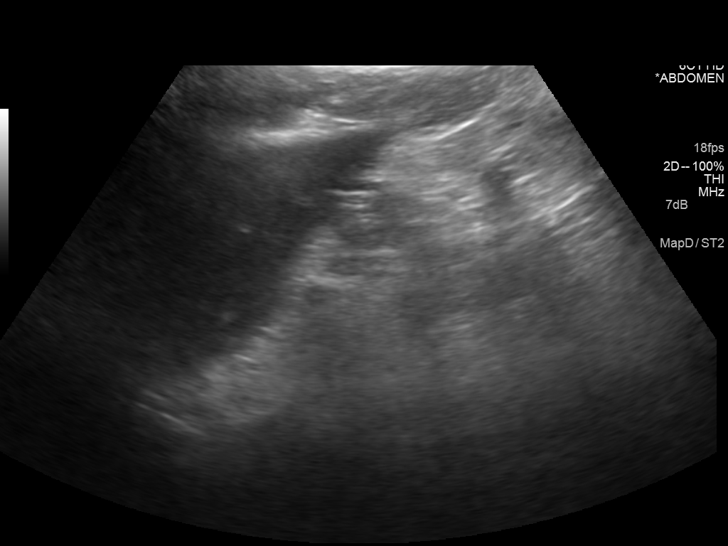
[im 4/47]
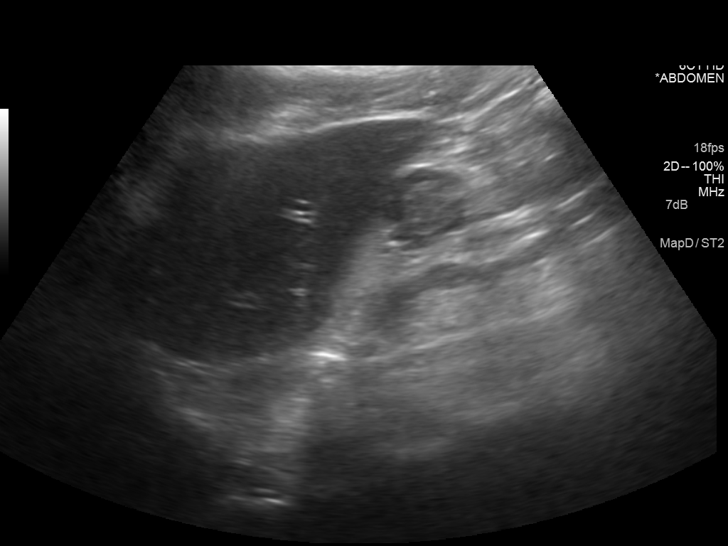
[im 8/47]
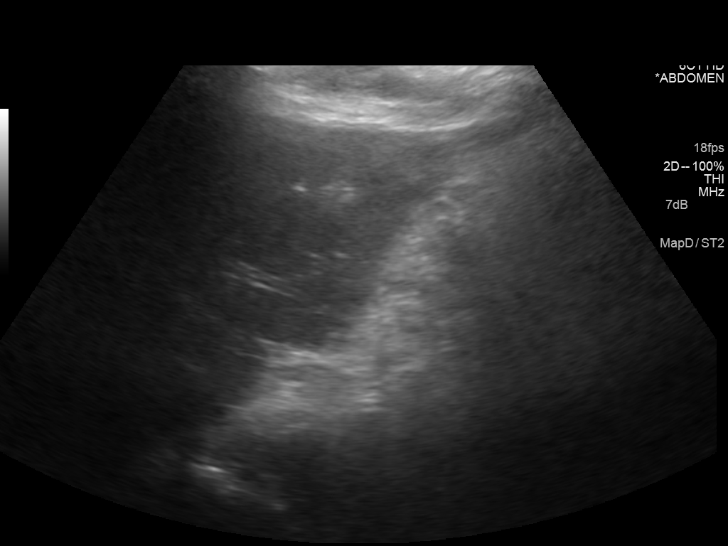
[im 12/47]
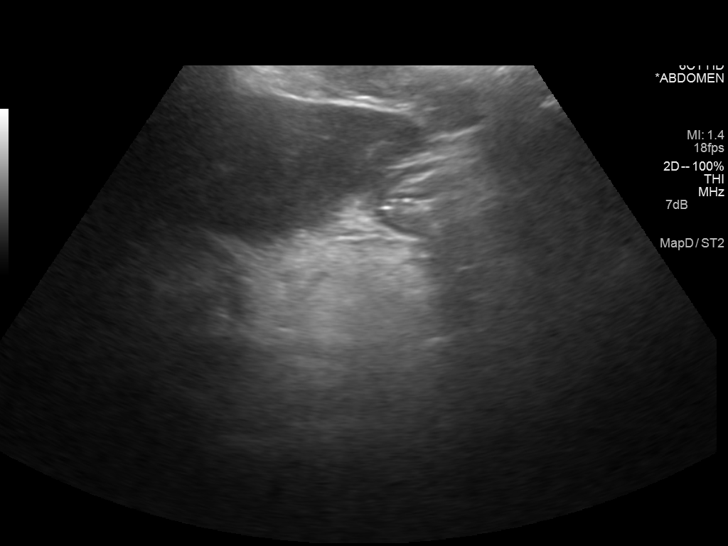
[im 16/47]
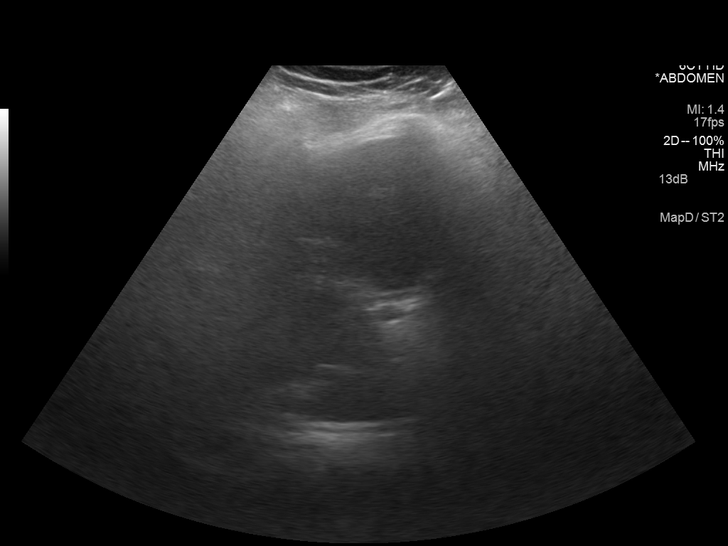
[im 18/47]
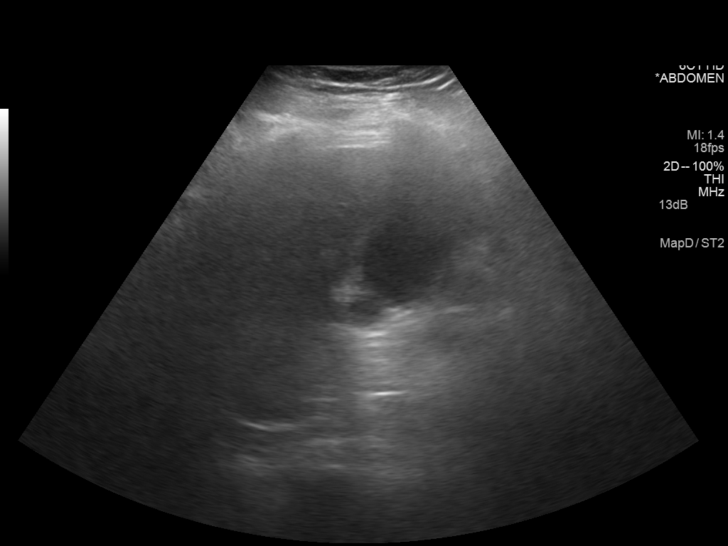
[im 22/47]
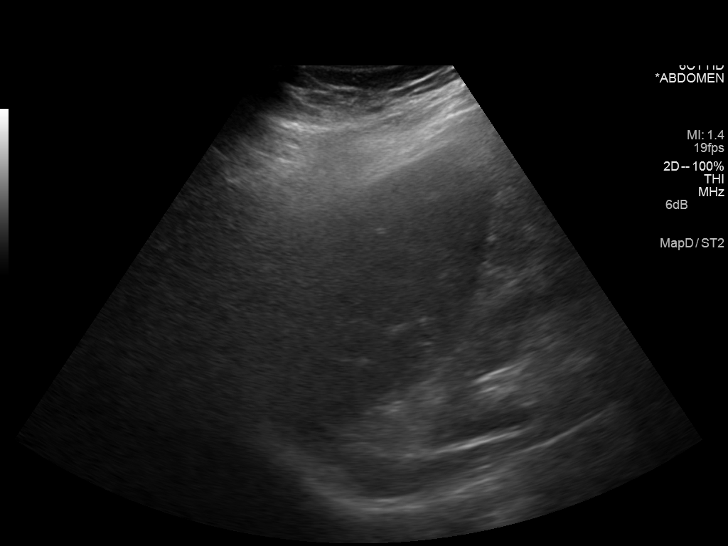
[im 25/47]
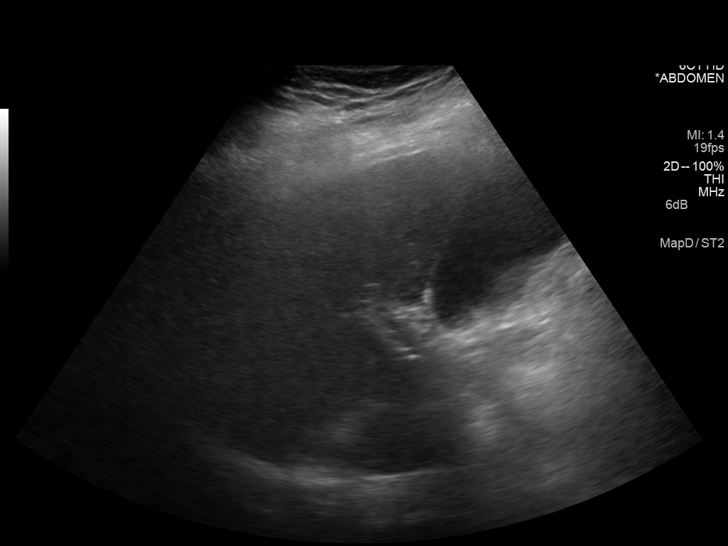
[im 29/47]
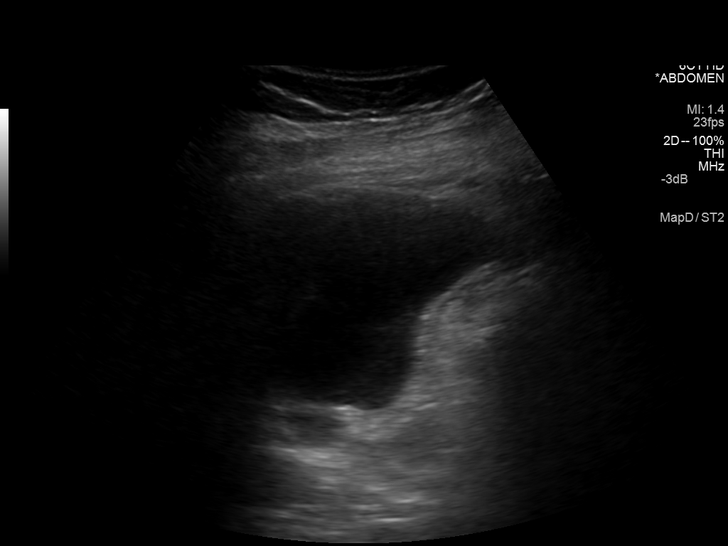
[im 31/47]
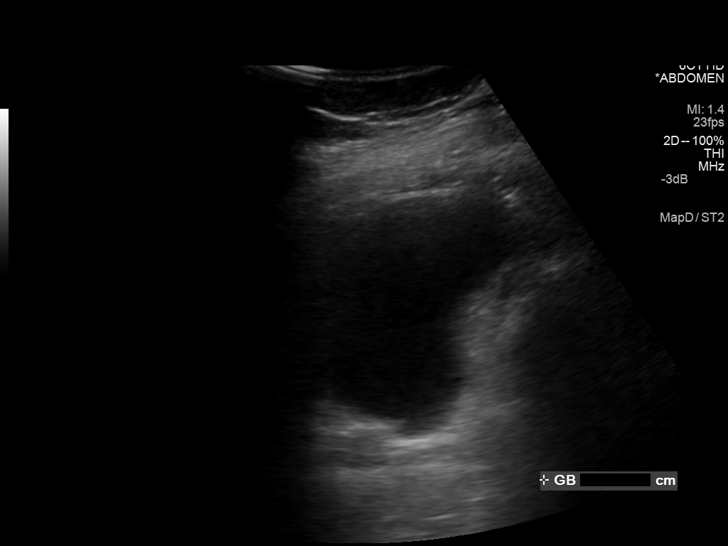
[im 35/47]
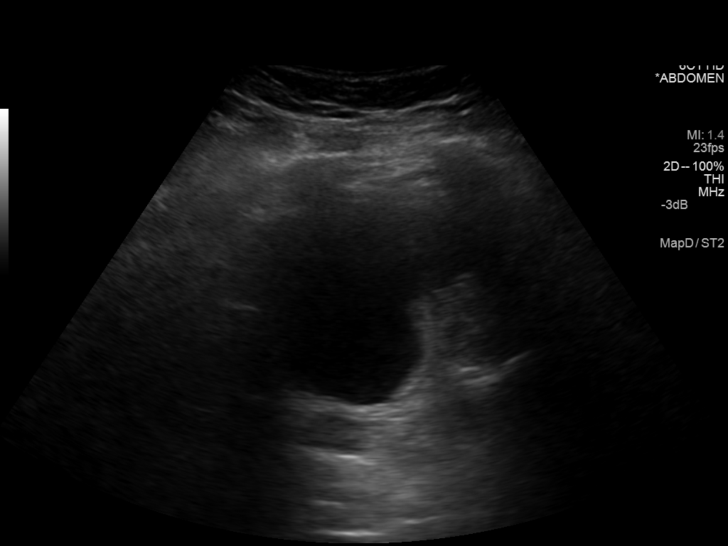
[im 39/47]
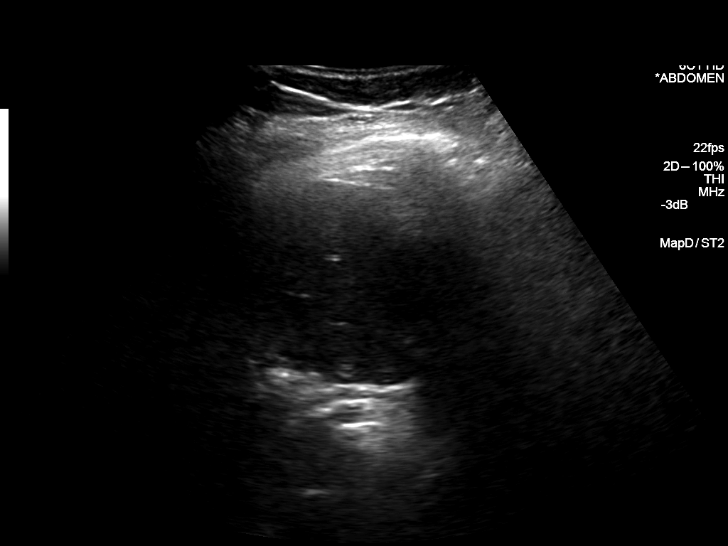
[im 43/47]
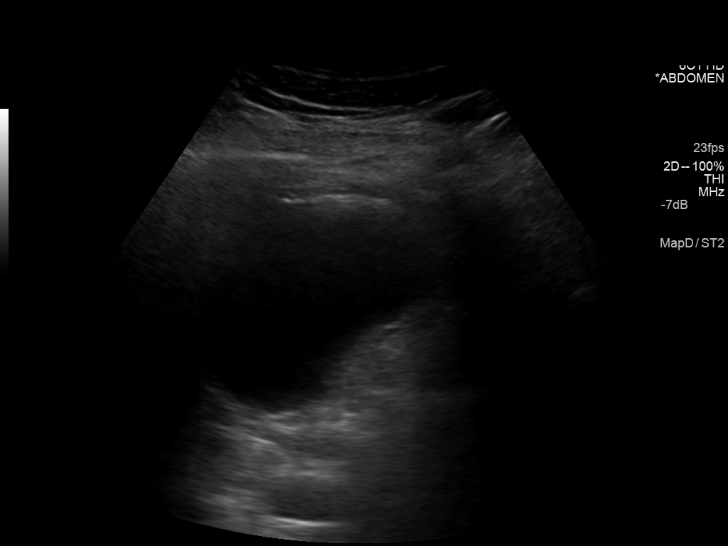
[im 47/47]
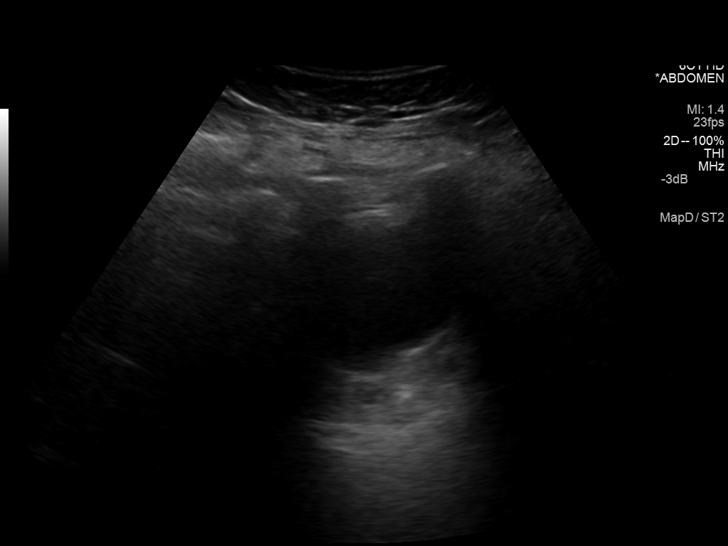

[14 of 25 positions shown; findings below may reference images not displayed]

FINDINGS: Gallbladder:

No gallstones or wall thickening visualized. There is no
pericholecystic fluid. No sonographic Murphy sign noted.

Common bile duct:

Diameter: 3 mm. There is no intrahepatic or extrahepatic biliary
duct dilatation.

Liver:

No focal lesion identified. Liver echogenicity is diffusely
increased.
IMPRESSION: Increased liver echogenicity, a finding most likely indicative of
hepatic steatosis. While no focal liver lesions are identified, it
should be noted that the sensitivity of ultrasound for focal liver
lesions is diminished in this circumstance. Study otherwise
unremarkable.

## 2015-05-31 IMAGING — US US INTRAOPERATIVE
1 series · 1 of 1 positions shown · non-contrast
Comparison: none

CLINICAL DATA: Obstructed cystic duct, acute cholecystitis

[Series 1: us intraoperative · 0.20mm/px · 1 of 1 slices shown]
[im 1/1]
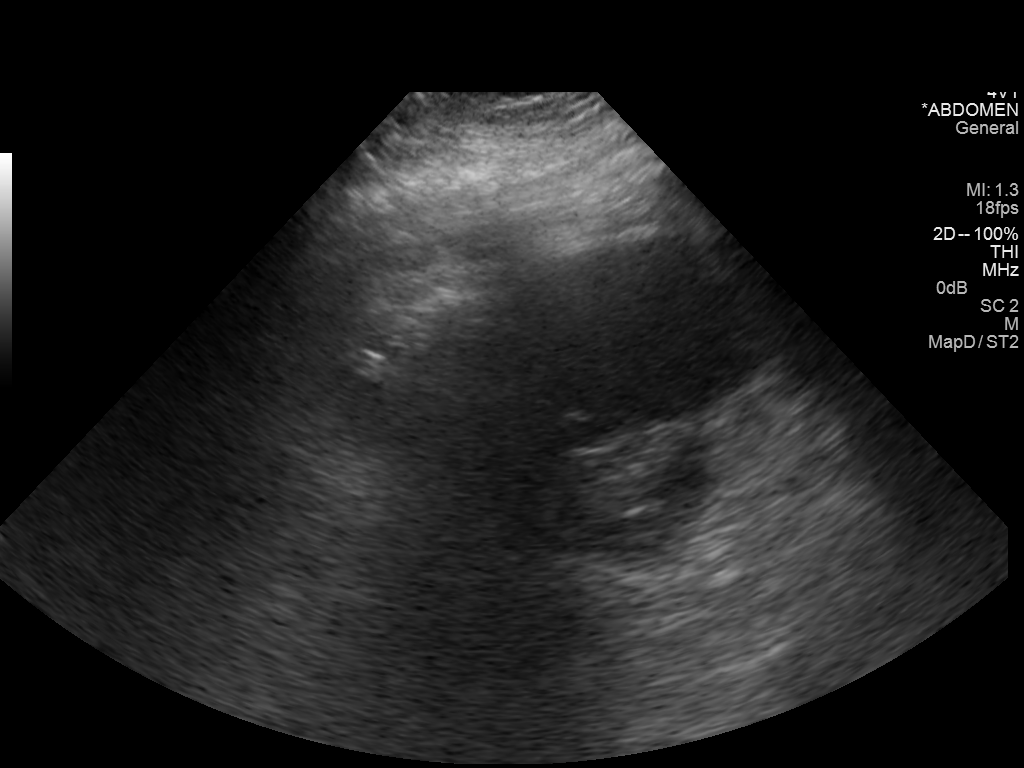

[1 of 1 positions shown; findings below may reference images not displayed]

EXAM:
CHOLECYSTOSTOMY

FLUOROSCOPY TIME:  3 min 18 seconds

MEDICATIONS AND MEDICAL HISTORY:
Versed 1 mg, Fentanyl 75 mcg.

Additional Medications: None

ANESTHESIA/SEDATION:
Moderate sedation time: 25 minutes

CONTRAST:  2 mL 4sovue-XSS

PROCEDURE:
The procedure, risks, benefits, and alternatives were explained to
the patient. Questions regarding the procedure were encouraged and
answered. The patient understands and consents to the procedure.

The right upper quadrant was prepped with chlorhexidine in a sterile
fashion, and a sterile drape was applied covering the operative
field. A sterile gown and sterile gloves were used for the
procedure.

A 21 gauge needle was inserted into the gallbladder lumen under
sonographic guidance and via transhepatic approach. It was removed
over a 0.018 wire, which was upsized to a 0.035 wire. An 8-French
drain was advanced over the wire and coiled in the gallbladder
lumen. It was sewn to the skin after being string fixed.
FINDINGS: The cystic duct is occluded.

COMPLICATIONS:
None
IMPRESSION: Successful cholecystostomy. This needs to remain in place at least 6
weeks.

## 2015-05-31 IMAGING — XA DG NEPHRO TUBE REMOVAL / FL
3 series · 3 of 3 positions shown · non-contrast
Comparison: none

CLINICAL DATA: Obstructed cystic duct, acute cholecystitis

[Series 1: fl - angio · 1 of 1 slices shown (1 of 3)]
[im 1/1]
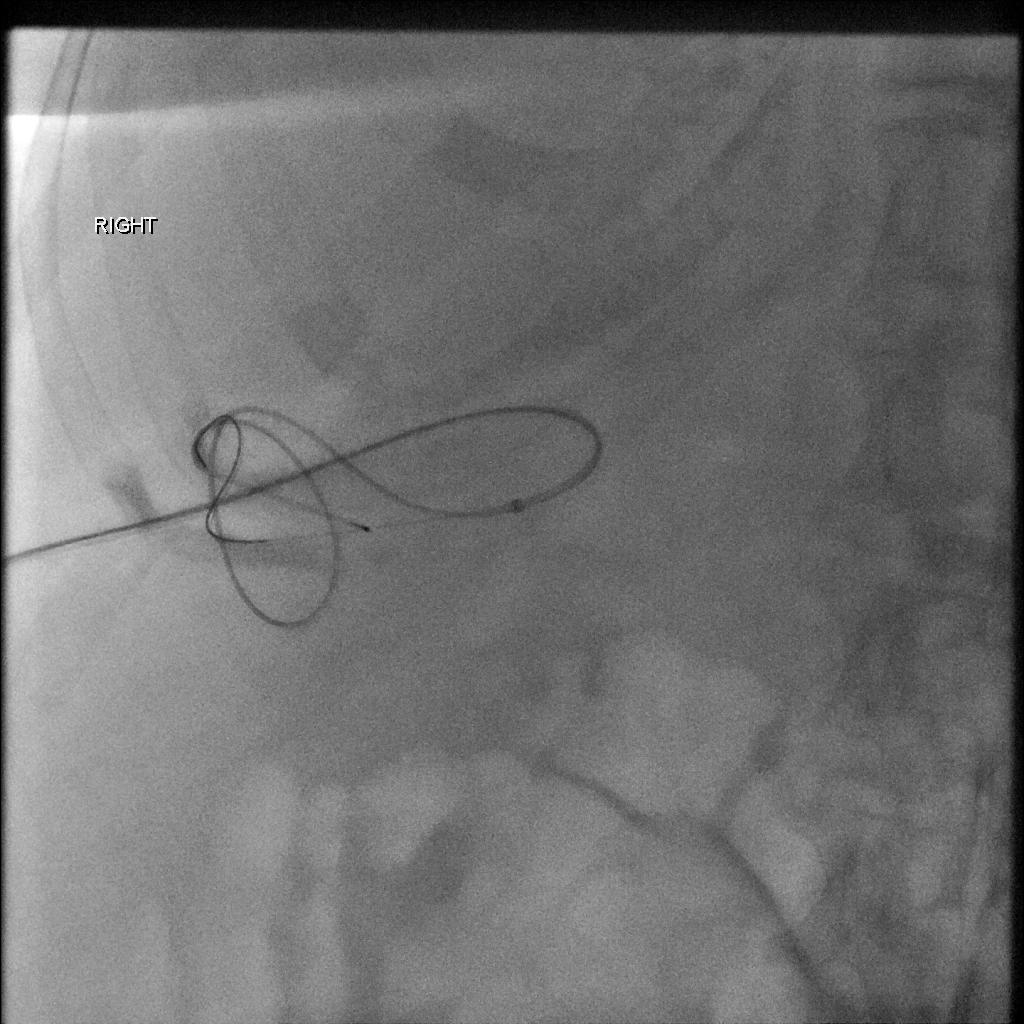

[Series 2: fl - angio · 1 of 1 slices shown (2 of 3)]
[im 1/1]
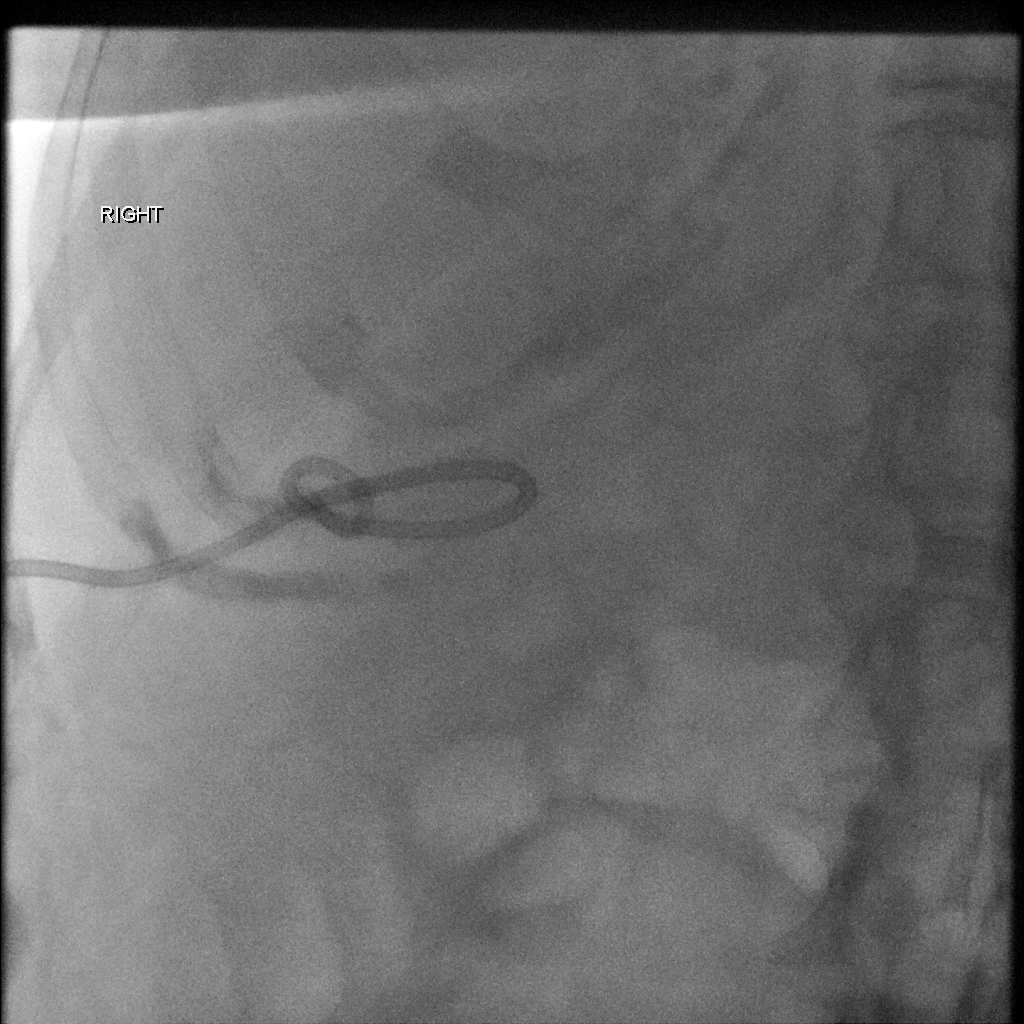

[Series 3: fl - angio · 1 of 1 slices shown (3 of 3)]
[im 1/1]
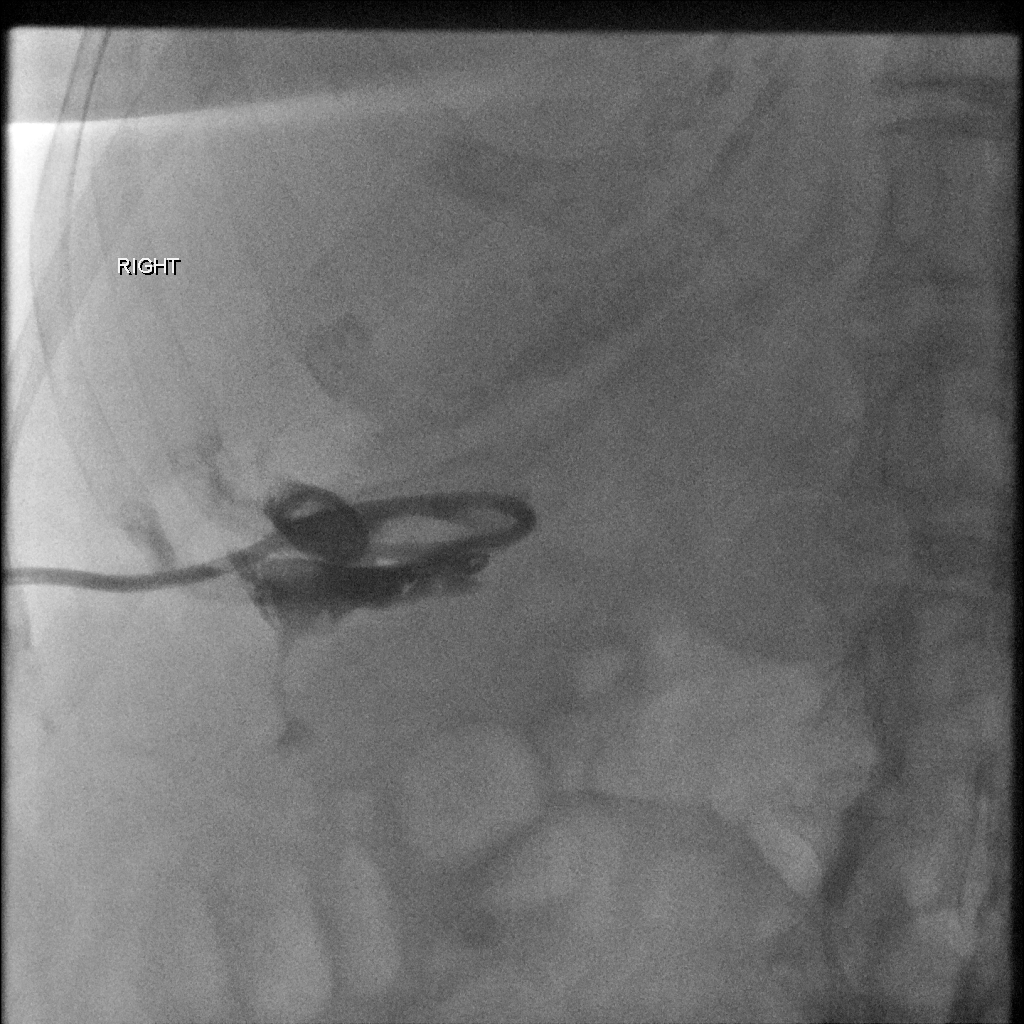

[3 of 3 positions shown; findings below may reference images not displayed]

EXAM:
CHOLECYSTOSTOMY

FLUOROSCOPY TIME:  3 min 18 seconds

MEDICATIONS AND MEDICAL HISTORY:
Versed 1 mg, Fentanyl 75 mcg.

Additional Medications: None

ANESTHESIA/SEDATION:
Moderate sedation time: 25 minutes

CONTRAST:  2 mL 4sovue-XSS

PROCEDURE:
The procedure, risks, benefits, and alternatives were explained to
the patient. Questions regarding the procedure were encouraged and
answered. The patient understands and consents to the procedure.

The right upper quadrant was prepped with chlorhexidine in a sterile
fashion, and a sterile drape was applied covering the operative
field. A sterile gown and sterile gloves were used for the
procedure.

A 21 gauge needle was inserted into the gallbladder lumen under
sonographic guidance and via transhepatic approach. It was removed
over a 0.018 wire, which was upsized to a 0.035 wire. An 8-French
drain was advanced over the wire and coiled in the gallbladder
lumen. It was sewn to the skin after being string fixed.
FINDINGS: The cystic duct is occluded.

COMPLICATIONS:
None
IMPRESSION: Successful cholecystostomy. This needs to remain in place at least 6
weeks.

## 2015-12-04 DEATH — deceased
